# Patient Record
Sex: Male | Born: 2000 | Race: White | Hispanic: No | Marital: Single | Smoking: Never smoker
Health system: Southern US, Community
[De-identification: ages and names within clinical notes are randomized; demographics above are authoritative.]

## PROBLEM LIST (undated history)

## (undated) DIAGNOSIS — F840 Autistic disorder: Secondary | ICD-10-CM

## (undated) DIAGNOSIS — F419 Anxiety disorder, unspecified: Secondary | ICD-10-CM

## (undated) HISTORY — PX: EYE SURGERY: SHX253

---
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
## 2020-11-08 ENCOUNTER — Encounter: Payer: Self-pay | Admitting: Nurse Practitioner

## 2020-11-08 ENCOUNTER — Other Ambulatory Visit: Payer: Self-pay

## 2020-11-08 ENCOUNTER — Other Ambulatory Visit: Payer: Self-pay | Admitting: Nurse Practitioner

## 2020-11-08 ENCOUNTER — Ambulatory Visit (INDEPENDENT_AMBULATORY_CARE_PROVIDER_SITE_OTHER): Payer: 59 | Admitting: Nurse Practitioner

## 2020-11-08 VITALS — BP 112/64 | HR 88 | Temp 98.4°F | Ht 70.4 in | Wt 154.6 lb

## 2020-11-08 DIAGNOSIS — Z008 Encounter for other general examination: Secondary | ICD-10-CM | POA: Diagnosis not present

## 2020-11-08 DIAGNOSIS — Z0000 Encounter for general adult medical examination without abnormal findings: Secondary | ICD-10-CM

## 2020-11-08 DIAGNOSIS — Z7689 Persons encountering health services in other specified circumstances: Secondary | ICD-10-CM | POA: Diagnosis not present

## 2020-11-08 NOTE — Patient Instructions (Signed)

## 2020-11-08 NOTE — Progress Notes (Signed)
I,Tianna Badgett,acting as a Education administrator for [employer], NP.,have documented all relevant documentation on the behalf of [employer], NP,as directed by  Bary Castilla, NP while in the presence of Bary Castilla, NP.  This visit occurred during the SARS-CoV-2 public health emergency.  Safety protocols were in place, including screening questions prior to the visit, additional usage of staff PPE, and extensive cleaning of exam room while observing appropriate contact time as indicated for disinfecting solutions.  Subjective:     Patient ID: Erik Saunders , male    DOB: Jun 27, 2000 , 20 y.o.   MRN: [id number]   Chief Complaint  Patient presents with  . Establish Care    HPI  Patient is here to establish care. He is a Ship broker at [employer].  He would like like to see a counselor concerning his autism.  He likes to walk, watches TV, listen to music, he likes to go to concerts. He likes to war hammer.  He was taking benadryl. But doesn't take any other medications at this time. He has no other concenrs at this time.  Diet: he eats anything.  Exercise: he doesn't but he tries to walk.  He last time saw a doctor in 2016.     Past Medical History:  Diagnosis Date  . Anxiety   . Autism      Family History  Problem Relation Age of Onset  . Bipolar disorder Mother   . Cancer Maternal Grandmother     No current outpatient medications on file.   Allergies  Allergen Reactions  . Penicillins Hives     Men's preventive visit. Patient Health Questionnaire (PHQ-2) is  [place] Office Visit from 11/08/2020 in [facility]  PHQ-2 Total Score 0    . Patient is on any diet. Marital status: Single. Relevant history for alcohol use is:  Social History   Substance and Sexual Activity  Alcohol Use No  . Relevant history for tobacco use is: none  Social History   Tobacco Use  Smoking Status Never Smoker  Smokeless Tobacco  Never Used  .   Review of Systems  Constitutional: Negative.  Negative for chills, fatigue and fever.  HENT: Negative.  Negative for congestion, ear pain, sinus pressure and sneezing.   Eyes: Negative.   Respiratory: Negative.  Negative for cough, chest tightness, shortness of breath and wheezing.   Cardiovascular: Negative.  Negative for chest pain.  Gastrointestinal: Negative.   Endocrine: Negative.  Negative for polydipsia, polyphagia and polyuria.  Genitourinary: Negative.  Negative for flank pain and testicular pain.  Musculoskeletal: Negative.  Negative for arthralgias, myalgias and neck stiffness.  Skin: Negative.   Allergic/Immunologic: Negative.   Neurological: Negative.  Negative for dizziness, weakness and numbness.  Hematological: Negative.   Psychiatric/Behavioral: Negative.  Negative for agitation.     Today's Vitals   11/08/20 0947  BP: 112/64  Pulse: 88  Temp: 98.4 F (36.9 C)  TempSrc: Oral  Weight: 154 lb 9.6 oz (70.1 kg)  Height: 5' 10.4" (1.788 m)   Body mass index is 21.93 kg/m.   Objective:  Physical Exam Vitals and nursing note reviewed.  Constitutional:      Appearance: Normal appearance.  HENT:     Head: Normocephalic and atraumatic.     Right Ear: Tympanic membrane, ear canal and external ear normal.     Left Ear: Tympanic membrane, ear canal and external ear normal.     Nose: Nose normal.  Mouth/Throat:     Mouth: Mucous membranes are moist.     Pharynx: Oropharynx is clear.  Eyes:     Extraocular Movements: Extraocular movements intact.     Conjunctiva/sclera: Conjunctivae normal.     Pupils: Pupils are equal, round, and reactive to light.  Cardiovascular:     Rate and Rhythm: Normal rate and regular rhythm.     Pulses: Normal pulses.     Heart sounds: Normal heart sounds. No murmur heard.   Pulmonary:     Effort: Pulmonary effort is normal.     Breath sounds: Normal breath sounds. No wheezing.  Chest:  Breasts:     Right:  Normal. No swelling, bleeding, inverted nipple, mass or nipple discharge.     Left: Normal. No swelling, bleeding, inverted nipple, mass or nipple discharge.    Abdominal:     General: Abdomen is flat. Bowel sounds are normal.     Palpations: Abdomen is soft.     Hernia: There is no hernia in the left inguinal area or right inguinal area.  Genitourinary:    Penis: Normal.      Testes: Normal.        Right: Tenderness not present.        Left: Tenderness not present.     Rectum: Guaiac result negative.  Musculoskeletal:        General: Normal range of motion.     Cervical back: Normal range of motion and neck supple.  Skin:    General: Skin is warm.     Capillary Refill: Capillary refill takes less than 2 seconds.  Neurological:     General: No focal deficit present.     Mental Status: He is alert and oriented to person, place, and time.  Psychiatric:        Mood and Affect: Mood normal.        Behavior: Behavior normal.         Assessment And Plan:    1. Establishing care with new doctor, encounter for -Patient is here to establish care. Martin Majestic over patient medical, family, social and surgical history. -Reviewed with patient their medications and any allergies  -Reviewed with patient their sexual orientation, drug/tobacco and alcohol use -Dicussed any new concerns with patient  -recommended patient comes in for a physical exam and complete blood work.  -Educated patient about the importance of annual screenings and immunizations.  -Advised patient to eat a healthy diet along with exercise for atleast 30-45 min atleast 4-5 days of the week.   2. Encounter for annual physical exam --Patient is here for their annual physical exam and we discussed any changes to medication and medical history.  -Behavior modification was discussed as well as diet and exercise history  -Patient will continue to exercise regularly and modify their diet.  -Recommendation for yearly physical  annuals, immunization and screenings including mammogram and colonoscopy were discussed with the patient.  -Recommended intake of multivitamin, vitamin D and calcium.  -Individualized advise was given to the patient pertaining to their own health history in regards to diet, exercise, medical condition and referrals.  - CBC - Hemoglobin A1c - CMP14+EGFR - Lipid panel - Hepatitis C antibody  3. Evaluation by psychiatric service required - Ambulatory referral to Psychology -Patient wants to be evaluated by a counselor or therapist for a program he is trying to get in for school.   The patient was encouraged to call or send a message through [employer] for any questions or concerns.  Patient was given opportunity to ask questions. Patient verbalized understanding of the plan and was able to repeat key elements of the plan. All questions were answered to their satisfaction.  Erik Winona Sison, DNP   I, Erik Wylie Coon have reviewed all documentation for this visit. The documentation on 11/08/20 for the exam, diagnosis, procedures, and orders are all accurate and complete.    THE PATIENT IS ENCOURAGED TO PRACTICE SOCIAL DISTANCING DUE TO THE COVID-19 PANDEMIC.

## 2020-11-09 LAB — CMP14+EGFR
ALT: 10 IU/L (ref 0–44)
AST: 22 IU/L (ref 0–40)
Albumin/Globulin Ratio: 1.6 (ref 1.2–2.2)
Albumin: 4.6 g/dL (ref 4.1–5.2)
Alkaline Phosphatase: 81 IU/L (ref 44–121)
BUN/Creatinine Ratio: 13 (ref 9–20)
BUN: 13 mg/dL (ref 6–20)
Bilirubin Total: 0.4 mg/dL (ref 0.0–1.2)
CO2: 24 mmol/L (ref 20–29)
Calcium: 9.9 mg/dL (ref 8.7–10.2)
Chloride: 103 mmol/L (ref 96–106)
Creatinine, Ser: 1.02 mg/dL (ref 0.76–1.27)
Globulin, Total: 2.8 g/dL (ref 1.5–4.5)
Glucose: 102 mg/dL — ABNORMAL HIGH (ref 65–99)
Potassium: 4.3 mmol/L (ref 3.5–5.2)
Sodium: 141 mmol/L (ref 134–144)
Total Protein: 7.4 g/dL (ref 6.0–8.5)
eGFR: 107 mL/min/{1.73_m2} (ref >59)

## 2020-11-09 LAB — HEMOGLOBIN A1C
Est. average glucose Bld gHb Est-mCnc: 100 mg/dL
Hgb A1c MFr Bld: 5.1 % (ref 4.8–5.6)

## 2020-11-09 LAB — CBC
Hematocrit: 40.8 % (ref 37.5–51.0)
Hemoglobin: 13.6 g/dL (ref 13.0–17.7)
MCH: 28.8 pg (ref 26.6–33.0)
MCHC: 33.3 g/dL (ref 31.5–35.7)
MCV: 86 fL (ref 79–97)
Platelets: 325 10*3/uL (ref 150–450)
RBC: 4.73 x10E6/uL (ref 4.14–5.80)
RDW: 11.9 % (ref 11.6–15.4)
WBC: 4.1 10*3/uL (ref 3.4–10.8)

## 2020-11-09 LAB — LIPID PANEL
Chol/HDL Ratio: 3.6 ratio (ref 0.0–5.0)
Cholesterol, Total: 161 mg/dL (ref 100–199)
HDL: 45 mg/dL (ref >39)
LDL Chol Calc (NIH): 104 mg/dL — ABNORMAL HIGH (ref 0–99)
Triglycerides: 59 mg/dL (ref 0–149)
VLDL Cholesterol Cal: 12 mg/dL (ref 5–40)

## 2020-11-09 LAB — HEPATITIS C ANTIBODY: Hep C Virus Ab: <0.1 s/co ratio (ref 0.0–0.9)

## 2020-12-05 ENCOUNTER — Other Ambulatory Visit: Payer: Self-pay

## 2020-12-05 ENCOUNTER — Ambulatory Visit (INDEPENDENT_AMBULATORY_CARE_PROVIDER_SITE_OTHER): Payer: 59 | Admitting: Nurse Practitioner

## 2020-12-05 VITALS — BP 104/62 | HR 84 | Temp 97.9°F | Ht 70.4 in | Wt 152.8 lb

## 2020-12-05 DIAGNOSIS — F840 Autistic disorder: Secondary | ICD-10-CM | POA: Diagnosis not present

## 2020-12-05 NOTE — Patient Instructions (Signed)
Living With Autism Spectrum Disorder Autism spectrum disorder (ASD) is a group of developmental disorders that affect how someone communicates, interacts with others, and behaves. ASD affects each person differently, so it is challenging to find treatments that can be effective for all people who have it. If you have been diagnosed with ASD, you may be relieved to now know why you have felt different or behaved a certain way. Still, you may have questions about the treatment ahead, how to get the support you need, and how to deal with ASD from day to day. With treatment and support, you can live a full life with ASD and manage your symptoms. How to manage lifestyle changes Managing stress   Stress is your body's reaction to life changes and events, both good and bad. Stress can last just a few hours, or it can be ongoing. Talk with your health care provider or therapist if you would like to learn more about ways to reduce your stress. Choose a method of lowering stress (stress reduction technique) that fits your lifestyle and personality, such as: Positive thinking. The things you say to yourself (self-talk) can be positive or negative. Positive self-talk can help you feel better. Deep breathing. To do this: Slowly breathe in through your nose and expand your belly. Hold your breath for 3-5 seconds. Slowly breathe out, letting your belly muscles relax. Muscle relaxation. To do this, tense your muscles on purpose and then relax them. Keeping a stress diary. This can help you learn what causes your stress to start (figure out your triggers) and how to control your response to those triggers. Adding humor to your life by watching funny films or TV shows. Getting plenty of sleep. Doing things that help you relax, such as exercise, music, or art.  Medicines Along with therapy, your health care provider may prescribe medicine to treat other conditions you have. These may include: Anxiety or  depression. Aggression or feeling irritable. Being unable to pay attention (inattention). Being overly active (hyperactivity). Sleep problems. Repeating physical or mental acts that you feel you have to do (compulsive behavior). Seizures. Talk with your pharmacist or health care provider about: All medicines that you take. The side effects they may cause. Which medicines are safe to take together. Avoid using alcohol and other substances that may prevent your medicines from working properly. Make it your goal to take part in all treatment decisions (shared decision-making). Ask about possible side effects of medicines that your health care provider recommends, and tell him or her how you feel about having those side effects. It is best if shared decision-making with your health care provider is part of your total treatment plan. Relationships Your health care provider may suggest social skills therapy along with individual therapy or medicine. With social skills training, you can: Learn about social cues and how to watch for them. Better understand nonverbal communication, such as facial expressions and body language. Learn appropriate responses in social situations. Improve your friendships. How to recognize changes in your condition Living with ASD is different for everyone. You and your health care provider will keep working together to decide next steps in your treatment plan. Still, it is important to watch for any symptoms of ASD that are getting worse. Talk with your health care provider if you feel that your symptoms are getting worse. Follow these instructions at home: Learn as much as you can about ASD. Make sure you understand it. Follow your treatment plan as told. Work closely   care providers and family to get educational, behavioral, and social therapies. Take over-the-counter and prescription medicines only as told by your health care provider. Check with your  health care provider before taking any new medicines. Keep all follow-up visits as told by your health care providers or therapists. This is important. Where to find support Talking to others Talking to friends and family about your ASD can give you support and guidance. Reach out to trusted family members or friends to explain your condition and how you are feeling. Also tell them that you are working with your health care provider. Start by telling them about any of your behaviors that are symptoms of ASD. Your diagnosis could help them understand why you sometimes have a hardtime connecting with friends or family. It is important for your family and close friends to learn as much as they can about your ASD. Doing this will help them understand your behavior and help youas needed. Finances When dealing with the costs of living with ASD, you can find financial help through not-for-profit organizations or with local government resources. You should also check with your insurance carrier to learn what ASD treatment iscovered by your plan. If you are taking medicines, you may be able to get the generic forms. These forms may cost less than brand-name medicines. Some makers of prescription medicines also offer help to people who cannot afford the medicines that theyneed. Therapy and support groups Your health care provider may recommend behavioral, educational, or social skills therapies. These involve working with a Warden/ranger, Child psychotherapist, Chiropractor, or other mental health provider. Therapy may help you reduce how severe your ASD symptoms are. It may also help you manage symptomsof other emotional or behavioral problems that you may have. Where to find more information Autism Speaks: AutismSpeaks.org Centers for Disease Control and Prevention Insurance claims handler): TelevisionDisplays.tn [employer] of Mental Health: WirelessNovelties.no World Health Organization:  [address].com.ee Chiropractor (ASHA): http://murphy-jones.com/ Autism Society: Editor, commissioning.org Asperger/Autism Network (AANE): aane.org Contact a health care provider if: You develop new symptoms. Your symptoms get worse or they do not get better with treatment. Get help right away if: You are acting in ways that harm yourself or others, or you have thoughts of hurting yourself or others. If you ever feel like you may hurt yourself or others, or have thoughts about taking your own life, get help right away. Go to your nearest emergency department or: Call your local emergency services (911 in the [place] Call a suicide crisis helpline, such as the National Suicide Prevention Lifeline at 226-681-4323. This is open 24 hours a day in the [place] Text the Crisis Text Line at 320-429-5674 (in the [place] Summary You can live a full life with ASD and manage your symptoms. A treatment plan that includes behavior therapy, social skills training, and medicines may help you improve your symptoms and manage stress. Your health care provider, friends, and family can give you support, and there are many not-for-profit and government resources to help people with ASD. This information is not intended to replace advice given to you by your health care provider. Make sure you discuss any questions you have with your healthcare provider. Document Revised: 07/13/2019 Document Reviewed: 07/13/2019 Elsevier Patient Education  2022 [employer].

## 2020-12-05 NOTE — Progress Notes (Signed)
I,Tianna Badgett,acting as a Neurosurgeon for [employer], NP.,have documented all relevant documentation on the behalf of [employer], NP,as directed by  Charlesetta Ivory, NP while in the presence of Charlesetta Ivory, NP.  This visit occurred during the SARS-CoV-2 public health emergency.  Safety protocols were in place, including screening questions prior to the visit, additional usage of staff PPE, and extensive cleaning of exam room while observing appropriate contact time as indicated for disinfecting solutions.  Subjective:     Patient ID: Erik Saunders , male    DOB: 2000/11/18 , 20 y.o.   MRN: [id number]   Chief Complaint  Patient presents with   Mental Health Problem    HPI  The patient is here because he was suppose to get a referral in for mental health evaluation for a program he is suppose to enroll in. The referral was already sent in on 11/08/20.   Mental Health Problem    Past Medical History:  Diagnosis Date   Anxiety    Autism      Family History  Problem Relation Age of Onset   Bipolar disorder Mother    Cancer Maternal Grandmother     No current outpatient medications on file.   Allergies  Allergen Reactions   Penicillins Hives     Review of Systems  Constitutional: Negative.   Respiratory: Negative.    Cardiovascular: Negative.   Gastrointestinal: Negative.   Neurological: Negative.     Today's Vitals   12/05/20 1533  BP: 104/62  Pulse: 84  Temp: 97.9 F (36.6 C)  TempSrc: Oral  Weight: 152 lb 12.8 oz (69.3 kg)  Height: 5' 10.4" (1.788 m)   Body mass index is 21.68 kg/m.   Objective:  Physical Exam      Assessment And Plan:     1. Autism  The patient is here to get a referral to be evaluated for mental health for a program he is trying to enroll in. The referral was placed on 11/08/20. The patient was given a call for [facility] and [facility]. They have both contacted the patient in  regards to scheduling appt for the patient. The patient and his care taker are here today to get clarification in regards to the referrals that are already in place. I have explained to the patient and his care taker that the referrals are taking a long time for patients to come in for an appointment. The patient and the care taker verbalized understanding and will call [facility] and [facility] to see who can take the patient quicker.   The patient was encouraged to call or send a message through MyChart for any questions or concerns.   Follow up: if symptoms persist or do not get better.   Patient was given opportunity to ask questions. Patient verbalized understanding of the plan and was able to repeat key elements of the plan. All questions were answered to their satisfaction.  [name], DNP   I, [name] have reviewed all documentation for this visit. The documentation on 12/05/20 for the exam, diagnosis, procedures, and orders are all accurate and complete.      IF YOU HAVE BEEN REFERRED TO A SPECIALIST, IT MAY TAKE 1-2 WEEKS TO SCHEDULE/PROCESS THE REFERRAL. IF YOU HAVE NOT HEARD FROM US/SPECIALIST IN TWO WEEKS, PLEASE GIVE [place] A CALL AT [phone].   THE PATIENT IS ENCOURAGED TO PRACTICE SOCIAL DISTANCING DUE TO THE COVID-19 PANDEMIC.

## 2020-12-24 ENCOUNTER — Ambulatory Visit: Payer: Self-pay | Admitting: Emergency Medicine

## 2021-02-01 ENCOUNTER — Other Ambulatory Visit: Payer: Self-pay

## 2021-02-01 ENCOUNTER — Ambulatory Visit (INDEPENDENT_AMBULATORY_CARE_PROVIDER_SITE_OTHER): Payer: 59

## 2021-02-01 ENCOUNTER — Ambulatory Visit (HOSPITAL_COMMUNITY)
Admission: EM | Admit: 2021-02-01 | Discharge: 2021-02-01 | Disposition: A | Discharge status: Home / Self Care | Payer: 59 | Attending: Internal Medicine | Admitting: Internal Medicine

## 2021-02-01 ENCOUNTER — Encounter (HOSPITAL_COMMUNITY): Payer: Self-pay | Admitting: *Deleted

## 2021-02-01 DIAGNOSIS — S93491A Sprain of other ligament of right ankle, initial encounter: Secondary | ICD-10-CM

## 2021-02-01 DIAGNOSIS — W19XXXA Unspecified fall, initial encounter: Secondary | ICD-10-CM

## 2021-02-01 DIAGNOSIS — M25571 Pain in right ankle and joints of right foot: Secondary | ICD-10-CM

## 2021-02-01 NOTE — Discharge Instructions (Addendum)
-  Use your boot while standing and walking while your pain persists -Tylenol/ibuprofen, rest, ice, elevation -If symptoms persist in 5-7 days, follow-up with an orthopedist. I recommend [facility] at [address], [place], [place] [address]. You can schedule an appointment by calling [phone]) or online ([email or web address]), but they also have a walk-in clinic M-F 8a-8p and Sat 10a-3p.

## 2021-02-01 NOTE — ED Provider Notes (Signed)
[facility]    CSN: [id number] Arrival date & time: 02/01/21  1221      History   Chief Complaint No chief complaint on file.   HPI Erik Saunders is a 20 y.o. male presenting with ankle issue. Medical history noncontributory.  States that he jumped a few days ago, landed and inverted the right ankle. Swelling to the outer aspect of the ankle. Purchased crutches which he has been using. Denies sensation changes. Denies falls.  HPI  Past Medical History:  Diagnosis Date   Anxiety    Autism     There are no problems to display for this patient.   Past Surgical History:  Procedure Laterality Date   EYE SURGERY         Home Medications    Prior to Admission medications   Not on File    Family History Family History  Problem Relation Age of Onset   Bipolar disorder Mother    Cancer Maternal Grandmother     Social History Social History   Tobacco Use   Smoking status: Never   Smokeless tobacco: Never  Substance Use Topics   Alcohol use: No   Drug use: No     Allergies   Penicillins   Review of Systems Review of Systems  Musculoskeletal:        R ankle pain    Physical Exam Triage Vital Signs ED Triage Vitals  Enc Vitals Group     BP      Pulse      Resp      Temp      Temp src      SpO2      Weight      Height      Head Circumference      Peak Flow      Pain Score      Pain Loc      Pain Edu?      Excl. in GC?    No data found.  Updated Vital Signs BP 121/70   Pulse 100   Temp 98.3 F (36.8 C)   Resp 20   SpO2 100%   Visual Acuity Right Eye Distance:   Left Eye Distance:   Bilateral Distance:    Right Eye Near:   Left Eye Near:    Bilateral Near:     Physical Exam Vitals reviewed.  Constitutional:      General: He is not in acute distress.    Appearance: Normal appearance. He is not ill-appearing or diaphoretic.  HENT:     Head: Normocephalic and atraumatic.  Cardiovascular:     Rate and Rhythm:  Normal rate and regular rhythm.     Heart sounds: Normal heart sounds.  Pulmonary:     Effort: Pulmonary effort is normal.     Breath sounds: Normal breath sounds.  Musculoskeletal:     Comments: R lateral ankle- effusion and tenderness lateral malleolus. TTP ATF ligament. ROM plantarflexion and dorsiflexion intact and without pain. Some pain with inversion. DP 2+, cap refill <2 seconds, sensation intact. No midfoot tenderness. No proximal tibial or fibular tenderness.   Skin:    General: Skin is warm.  Neurological:     General: No focal deficit present.     Mental Status: He is alert and oriented to person, place, and time.  Psychiatric:        Mood and Affect: Mood normal.        Behavior: Behavior normal.  Thought Content: Thought content normal.        Judgment: Judgment normal.     UC Treatments / Results  Labs (all labs ordered are listed, but only abnormal results are displayed) Labs Reviewed - No data to display  EKG   Radiology DG Ankle Complete Right  Result Date: 02/01/2021 CLINICAL DATA:  fall with pain and swelling to RT ankle EXAM: RIGHT ANKLE - COMPLETE 3+ VIEW COMPARISON:  None. FINDINGS: There is ankle soft tissue swelling, most prominent along the lateral ankle. There is no visible acute fracture. There is a tibiotalar joint effusion. Os trigonum. IMPRESSION: Ankle soft tissue swelling, most prominent along the lateral ankle. Tibiotalar joint effusion. No visible fracture. Electronically Signed   By: Caprice Renshaw M.D.   On: 02/01/2021 14:48    Procedures Procedures (including critical care time)  Medications Ordered in UC Medications - No data to display  Initial Impression / Assessment and Plan / UC Course  I have reviewed the triage vital signs and the nursing notes.  Pertinent labs & imaging results that were available during my care of the patient were reviewed by me and considered in my medical decision making (see chart for details).      This patient is a very pleasant 20 y.o. year old male presenting with R ankle sprain. Neurovascularly intact.   Xray R ankle- Ankle soft tissue swelling, most prominent along the lateral ankle. Tibiotalar joint effusion. No visible fracture.  CAM boot, crutches, f/u with ortho.   ED return precautions discussed. Patient verbalizes understanding and agreement.   Final Clinical Impressions(s) / UC Diagnoses   Final diagnoses:  Sprain of anterior talofibular ligament of right ankle, initial encounter     Discharge Instructions      -Use your boot while standing and walking while your pain persists -Tylenol/ibuprofen, rest, ice, elevation -If symptoms persist in 5-7 days, follow-up with an orthopedist. I recommend [facility] at [address], [place], [place] [address]. You can schedule an appointment by calling [phone]) or online ([email or web address]), but they also have a walk-in clinic M-F 8a-8p and Sat 10a-3p.      ED Prescriptions   None    PDMP not reviewed this encounter.   Erik Martini, PA-C 02/01/21 [phone]

## 2021-02-01 NOTE — ED Triage Notes (Signed)
Pt presented with injury to Rt ankle with swelling and Pain. Pt had his own crutches on arrival to UC.

## 2021-05-21 ENCOUNTER — Ambulatory Visit: Payer: 59 | Admitting: Nurse Practitioner

## 2021-05-21 ENCOUNTER — Other Ambulatory Visit: Payer: Self-pay

## 2021-05-21 ENCOUNTER — Encounter: Payer: Self-pay | Admitting: Internal Medicine

## 2021-05-21 ENCOUNTER — Ambulatory Visit (INDEPENDENT_AMBULATORY_CARE_PROVIDER_SITE_OTHER): Payer: 59 | Admitting: Internal Medicine

## 2021-05-21 VITALS — BP 110/62 | HR 95 | Temp 98.6°F | Ht 70.4 in | Wt 150.8 lb

## 2021-05-21 DIAGNOSIS — Z23 Encounter for immunization: Secondary | ICD-10-CM | POA: Insufficient documentation

## 2021-05-21 DIAGNOSIS — R3121 Asymptomatic microscopic hematuria: Secondary | ICD-10-CM | POA: Diagnosis not present

## 2021-05-21 DIAGNOSIS — R339 Retention of urine, unspecified: Secondary | ICD-10-CM | POA: Diagnosis not present

## 2021-05-21 LAB — POCT URINALYSIS DIPSTICK
Bilirubin, UA: NEGATIVE
Glucose, UA: NEGATIVE
Ketones, UA: POSITIVE
Leukocytes, UA: NEGATIVE
Nitrite, UA: NEGATIVE
Protein, UA: NEGATIVE
Spec Grav, UA: 1.02 (ref 1.010–1.025)
Urobilinogen, UA: 1 E.U./dL
pH, UA: 7.5 (ref 5.0–8.0)

## 2021-05-21 NOTE — Progress Notes (Signed)
Jeri Cos [place],acting as a Neurosurgeon for Gwynneth Aliment, MD.,have documented all relevant documentation on the behalf of Gwynneth Aliment, MD,as directed by  Gwynneth Aliment, MD while in the presence of Gwynneth Aliment, MD.  This visit occurred during the SARS-CoV-2 public health emergency.  Safety protocols were in place, including screening questions prior to the visit, additional usage of staff PPE, and extensive cleaning of exam room while observing appropriate contact time as indicated for disinfecting solutions.  Subjective:     Patient ID: Erik Saunders , male    DOB: [birth year] , 20 y.o.   MRN: [id number]   Chief Complaint  Patient presents with   Urinary Retention     HPI  Patient presents today for urinary concerns. He stated "he has been having issues with this for several years he just thought it was normal but recently found out its not normal."  He states that he has sensation that he cannot void all of his urine. He denies dysuria, urinary frequency; however, admits to dribbling. He denies having any sexual intercourse. He denies having any abdominal pain. He is not having any sx today. He requests referral to Urology for further evaluation of his sx. He is concerned about contractures and having a prostate issue.    Past Medical History:  Diagnosis Date   Anxiety    Autism      Family History  Problem Relation Age of Onset   Bipolar disorder Mother    Cancer Maternal Grandmother     No current outpatient medications on file.   Allergies  Allergen Reactions   Penicillins Hives     Review of Systems  Constitutional: Negative.   Respiratory: Negative.    Cardiovascular: Negative.   Gastrointestinal: Negative.   Neurological: Negative.   Psychiatric/Behavioral: Negative.      Today's Vitals   05/21/21 1458  BP: 110/62  Pulse: 95  Temp: 98.6 F (37 C)  Weight: 150 lb 12.8 oz (68.4 kg)  Height: 5' 10.4" (1.788 m)  PainSc: 0-No pain   Body mass  index is 21.39 kg/m.  Wt Readings from Last 3 Encounters:  05/21/21 150 lb 12.8 oz (68.4 kg)  12/05/20 152 lb 12.8 oz (69.3 kg)  11/08/20 154 lb 9.6 oz (70.1 kg)     Objective:  Physical Exam Vitals and nursing note reviewed.  Constitutional:      Appearance: Normal appearance.  HENT:     Head: Normocephalic and atraumatic.     Nose:     Comments: Masked     Mouth/Throat:     Comments: Masked  Eyes:     Extraocular Movements: Extraocular movements intact.  Cardiovascular:     Rate and Rhythm: Normal rate and regular rhythm.     Heart sounds: Normal heart sounds.  Pulmonary:     Effort: Pulmonary effort is normal.     Breath sounds: Normal breath sounds.  Abdominal:     General: There is no distension.     Tenderness: There is no abdominal tenderness. There is no right CVA tenderness or left CVA tenderness.  Musculoskeletal:     Cervical back: Normal range of motion.  Skin:    General: Skin is warm.  Neurological:     General: No focal deficit present.     Mental Status: He is alert.  Psychiatric:        Mood and Affect: Mood normal.        Assessment And Plan:  1. Urinary retention Comments: No leuks or nitrites in u/a, will treat empirically for UTI. I will also refer him to Urology as requested.  - POCT Urinalysis Dipstick (81002)  2. Asymptomatic microscopic hematuria Comments: Pt advised this will be repeated at his next visit.   3. Immunization due Comments: He was given flu vaccine.  - Flu Vaccine QUAD 6+ mos PF IM (Fluarix Quad PF)    Patient was given opportunity to ask questions. Patient verbalized understanding of the plan and was able to repeat key elements of the plan. All questions were answered to their satisfaction.   I, Maximino Greenland, MD, have reviewed all documentation for this visit. The documentation on 05/21/21 for the exam, diagnosis, procedures, and orders are all accurate and complete.   IF YOU HAVE BEEN REFERRED TO A SPECIALIST,  IT MAY TAKE 1-2 WEEKS TO SCHEDULE/PROCESS THE REFERRAL. IF YOU HAVE NOT HEARD FROM US/SPECIALIST IN TWO WEEKS, PLEASE GIVE [place] A CALL AT [phone].   THE PATIENT IS ENCOURAGED TO PRACTICE SOCIAL DISTANCING DUE TO THE COVID-19 PANDEMIC.

## 2021-05-21 NOTE — Patient Instructions (Signed)
Acute Urinary Retention, Male ?Acute urinary retention is a condition in which a person is unable to pass urine or can only pass a little urine. This condition can happen suddenly and last for a short time. If left untreated, it can become long-term (chronic) and result in kidney damage or other serious complications. ?What are the causes? ?This condition may be caused by: ?Obstruction or narrowing of the tube that drains the bladder (urethra). This may be caused by surgery, problems with nearby organs, or injury to the bladder or urethra. ?Problems with the nerves in the bladder. ?Tumors in the area of the pelvis, bladder, or urethra. ?Certain medicines. ?Bladder or urinary tract infection. ?Constipation. ?What increases the risk? ?This condition is more likely to develop in older men. As men age, their prostate may become larger and may start to press or squeeze on the bladder or the urethra. Other chronic health conditions can increase the risk of acute urinary retention. These include: ?Diseases such as multiple sclerosis. ?Spinal cord injuries. ?Diabetes. ?Degenerative cognitive conditions, such as delirium or dementia. ?Psychological conditions. A man may hold his urine due to trauma or because he does not want to use the bathroom. ?What are the signs or symptoms? ?Symptoms of this condition include: ?Trouble urinating. ?Pain in the lower abdomen. ?How is this diagnosed? ?This condition is diagnosed based on a physical exam and your medical history. You may also have other tests, including: ?An ultrasound of the bladder or kidneys or both. ?Blood tests. ?A urine analysis. ?Additional tests may be needed, such as a CT scan, MRI, and kidney or bladder function tests. ?How is this treated? ?Treatment for this condition may include: ?Medicines. ?Placing a thin, sterile tube (catheter) into the bladder to drain urine out of the body. This is called an indwelling urinary catheter. After it is inserted, the catheter  is held in place with a small balloon that is filled with sterile water. Urine drains from the catheter into a collection bag outside of the body. ?Behavioral therapy. ?Treatment for other conditions. ?If needed, you may be treated in the hospital for kidney function problems or to manage other complications. ?Follow these instructions at home: ?Medicines ?Take over-the-counter and prescription medicines only as told by your health care provider. Avoid certain medicines, such as decongestants, antihistamines, and some prescription medicines. Do not take any medicine unless your health care provider approves. ?If you were prescribed an antibiotic medicine, take it as told by your health care provider. Do not stop using the antibiotic even if you start to feel better. ?General instructions ?Do not use any products that contain nicotine or tobacco. These products include cigarettes, chewing tobacco, and vaping devices, such as e-cigarettes. If you need help quitting, ask your health care provider. ?Drink enough fluid to keep your urine pale yellow. ?If you have an indwelling urinary catheter, follow the instructions from your health care provider. ?Monitor any changes in your symptoms. Tell your health care provider about any changes. ?If instructed, monitor your blood pressure at home. Report changes as told by your health care provider. ?Keep all follow-up visits. This is important. ?Contact a health care provider if: ?You have uncomfortable bladder contractions that you cannot control (spasms). ?You leak urine with the spasms. ?Get help right away if: ?You have chills or a fever. ?You have blood in your urine. ?You have a catheter and the following happens: ?Your catheter stops draining urine. ?Your catheter falls out. ?Summary ?Acute urinary retention is a condition in which   a person is unable to pass urine or can only pass a little urine. If left untreated, this condition can result in kidney damage or other  serious complications. ?An enlarged prostate may cause this condition. As men age, their prostate gland may become larger and may press or squeeze on the bladder or the urethra. ?Treatment for this condition may include medicines and placement of an indwelling urinary catheter. ?Monitor any changes in your symptoms. Tell your health care provider about any changes. ?This information is not intended to replace advice given to you by your health care provider. Make sure you discuss any questions you have with your health care provider. ?Document Revised: 02/15/2020 Document Reviewed: 02/15/2020 ?Elsevier Patient Education ? 2022 Elsevier Inc. ? ?

## 2021-05-27 ENCOUNTER — Other Ambulatory Visit: Payer: Self-pay

## 2021-05-27 MED ORDER — DOXYCYCLINE HYCLATE 100 MG PO TABS: 100.0000 mg | ORAL_TABLET | Freq: Two times a day (BID) | ORAL | 0 refills | Status: AC

## 2021-11-14 ENCOUNTER — Encounter: Payer: 59 | Admitting: Nurse Practitioner

## 2021-11-20 ENCOUNTER — Encounter: Payer: 59 | Admitting: Nurse Practitioner

## 2022-07-24 IMAGING — DX DG ANKLE COMPLETE 3+V*R*
3 series · 3 of 3 positions shown · non-contrast
Comparison: None.

CLINICAL DATA: fall with pain and swelling to RT ankle

EXAM:
RIGHT ANKLE - COMPLETE 3+ VIEW

[ankle ap]
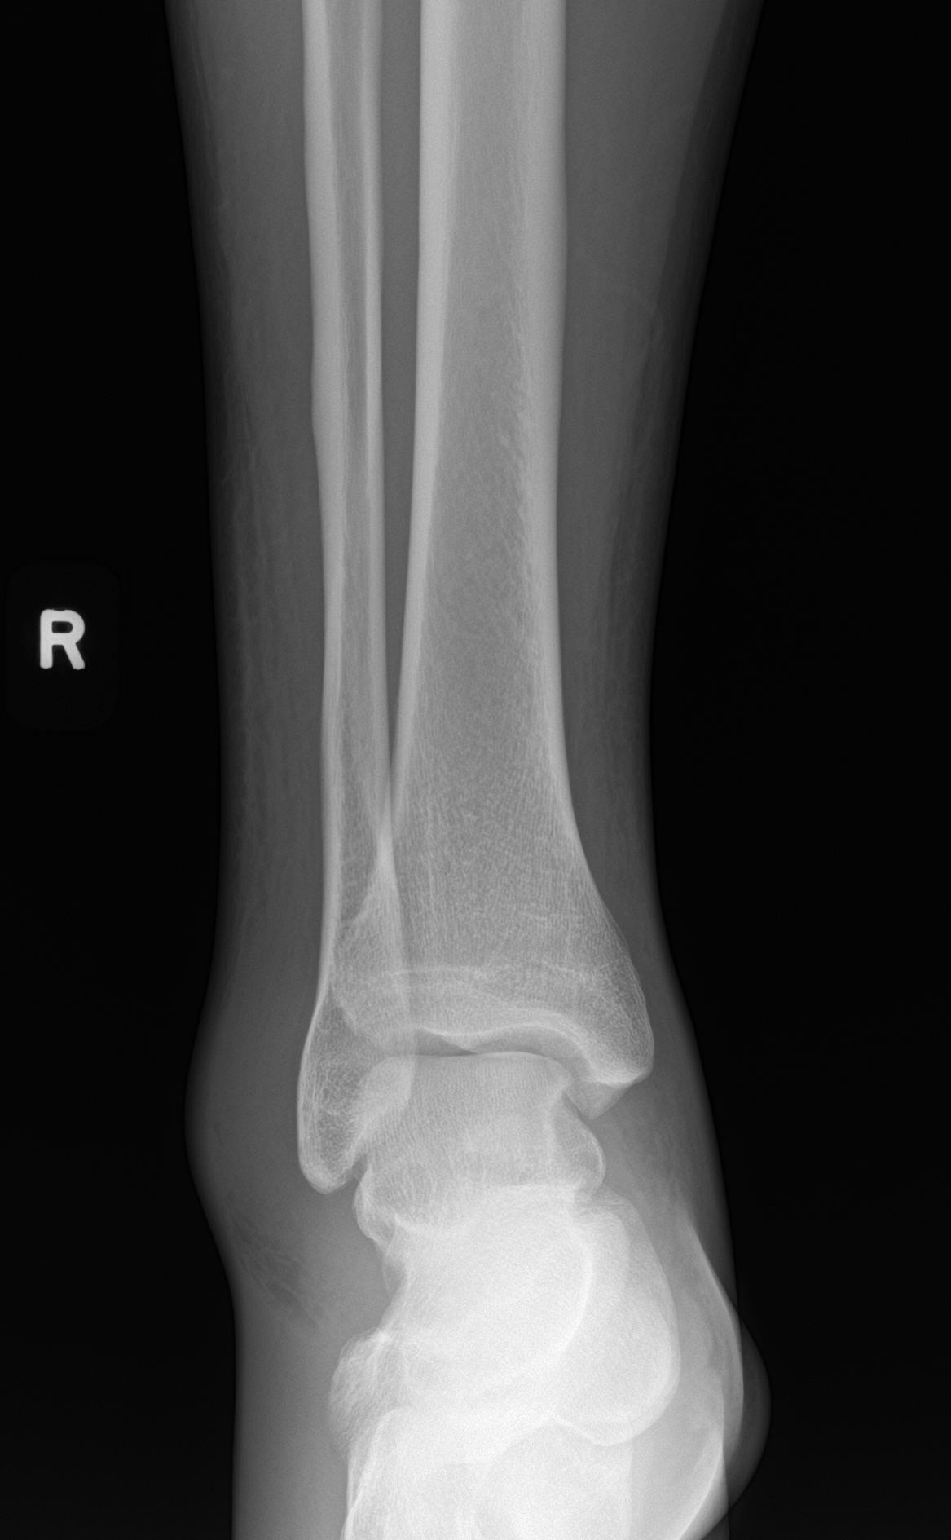

[ankle obl]
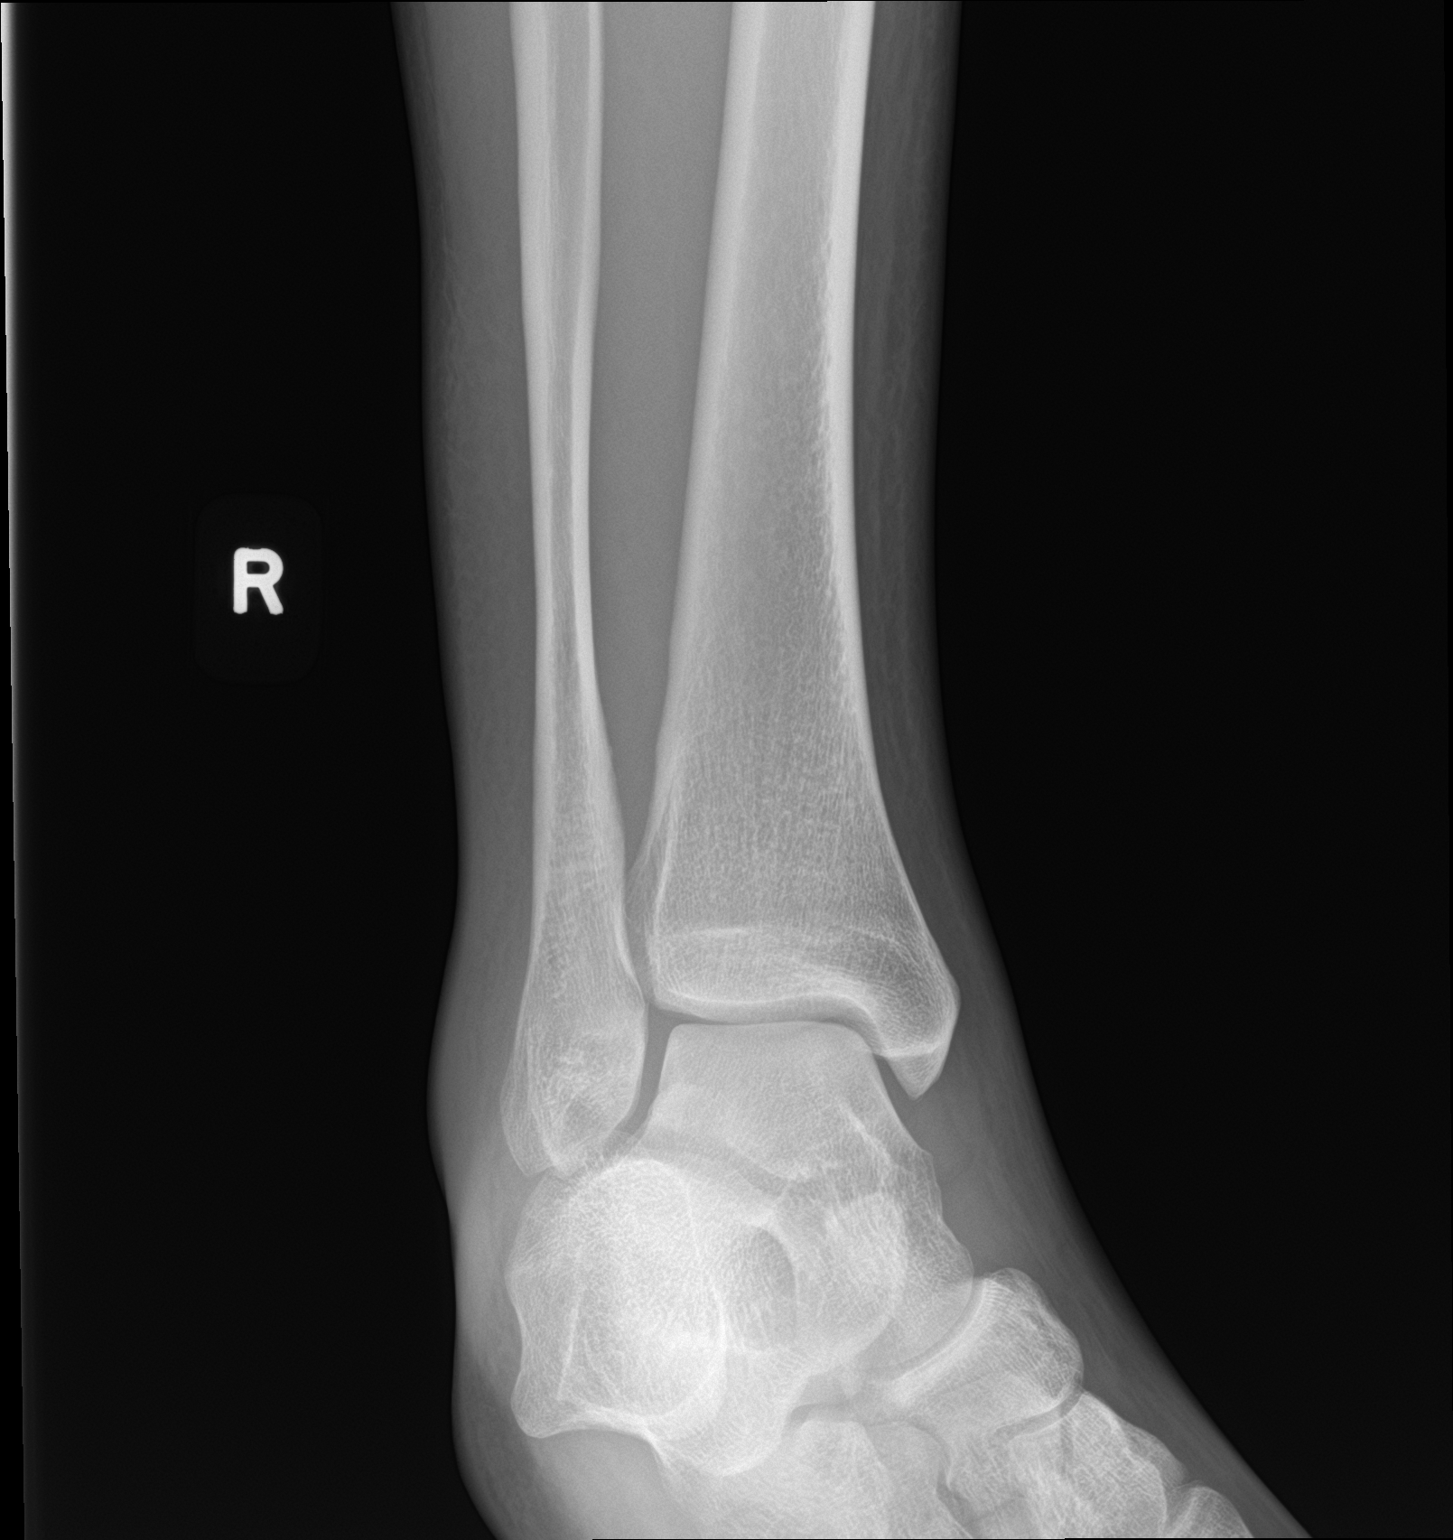

[ankle lat]
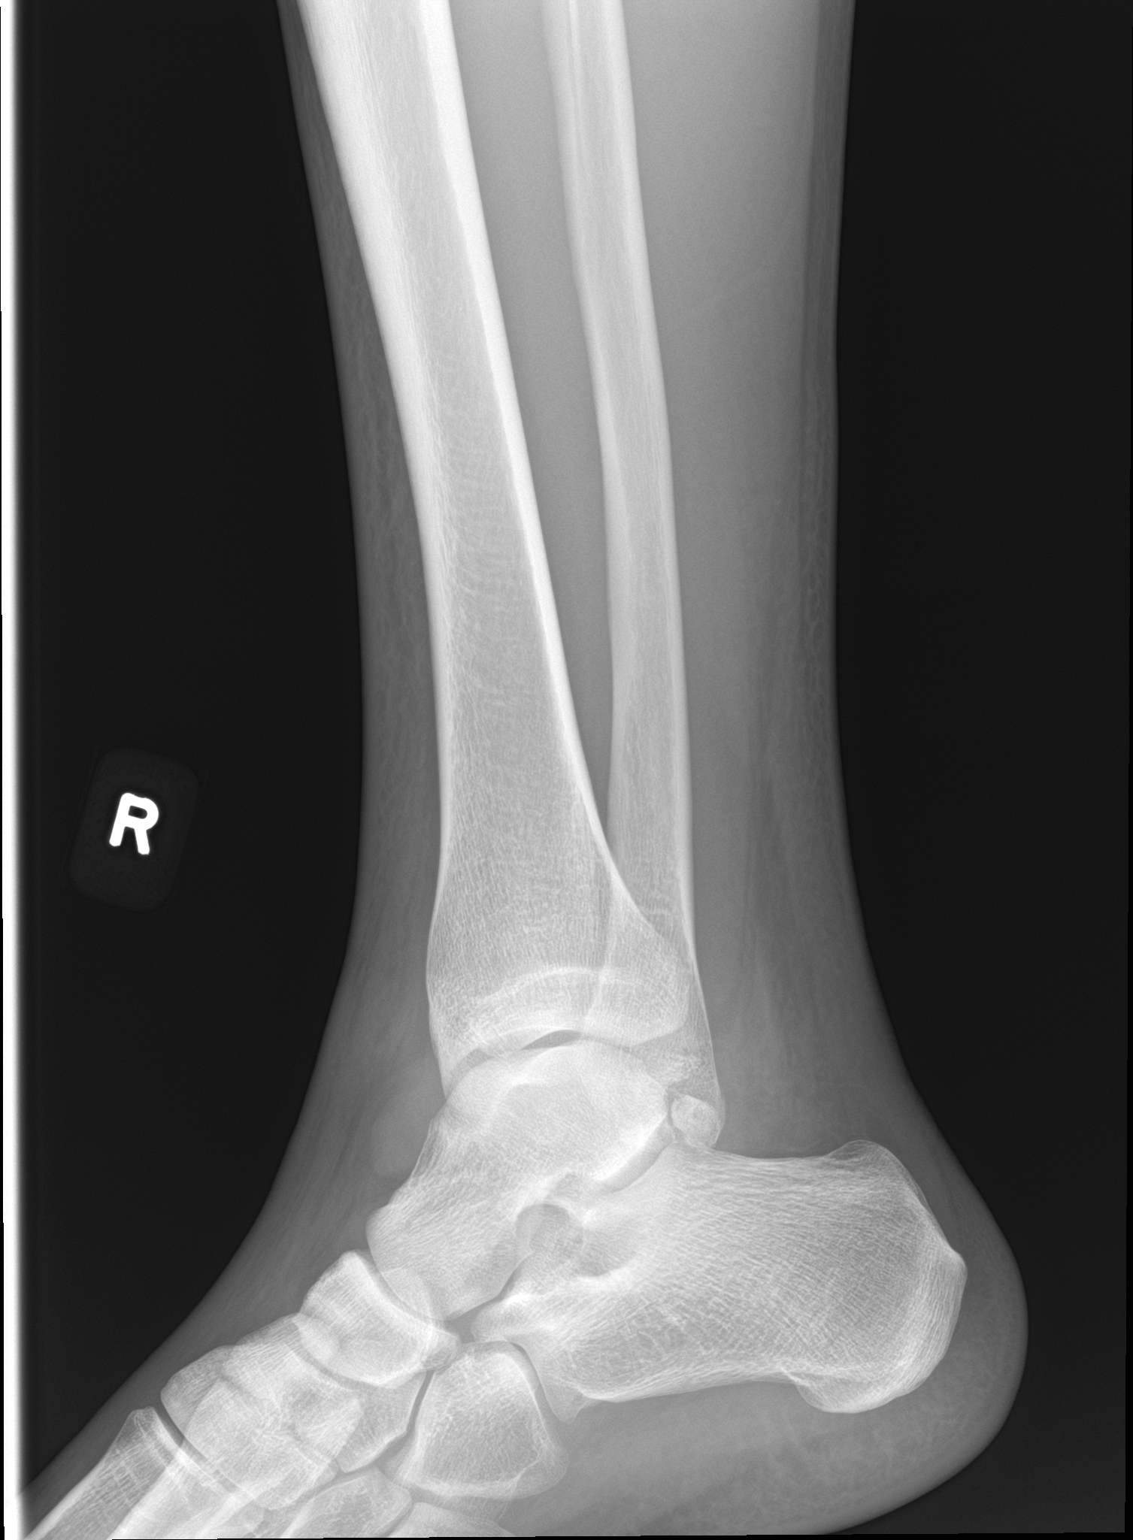

[3 of 3 positions shown; findings below may reference images not displayed]

FINDINGS: There is ankle soft tissue swelling, most prominent along the
lateral ankle. There is no visible acute fracture. There is a
tibiotalar joint effusion. Os trigonum.
IMPRESSION: Ankle soft tissue swelling, most prominent along the lateral ankle.
Tibiotalar joint effusion. No visible fracture.

## 2022-09-18 ENCOUNTER — Ambulatory Visit (HOSPITAL_COMMUNITY): Payer: Medicaid Other | Admitting: Mental Health

## 2022-09-18 DIAGNOSIS — F840 Autistic disorder: Secondary | ICD-10-CM

## 2022-09-18 DIAGNOSIS — F411 Generalized anxiety disorder: Secondary | ICD-10-CM | POA: Insufficient documentation

## 2022-09-19 NOTE — Progress Notes (Signed)
Comprehensive Clinical Assessment (CCA) Note  09/19/2022 Erik Saunders [id number]  Chief Complaint:  Chief Complaint  Patient presents with   Autism   Anxiety   Establish Care   Visit Diagnosis: GAD, Autism spectrum disorder     CCA Screening, Triage and Referral (STR)  Patient Reported Information How did you hear about [place]? Self  Whom do you see for routine medical problems? Primary Care  Practice/Facility Name: Dr. Allyne Gee - [facility]  What Is the Reason for Your Visit/Call Today? "I feel like whats been going on overall. I feel like I have a little bit of a temper issues sometimes."  How Long Has This Been Causing You Problems? > than 6 months  What Do You Feel Would Help You the Most Today? Treatment for Depression or other mood problem   Have You Recently Been in Any Inpatient Treatment (Hospital/Detox/Crisis Center/28-Day Program)? No  Have You Ever Received Services From [employer] Before? No  Have You Recently Had Any Thoughts About Hurting Yourself? No  Are You Planning to Commit Suicide/Harm Yourself At This time? No   Have you Recently Had Thoughts About Hurting Someone Karolee Ohs? No   Have You Used Any Alcohol or Drugs in the Past 24 Hours? No  Do You Currently Have a Therapist/Psychiatrist? No  Have You Been Recently Discharged From Any Office Practice or Programs? No     CCA Screening Triage Referral Assessment Type of Contact: Face-to-Face  Does Patient Have a Court Appointed Legal Guardian? No  Is CPS involved or ever been involved? Never  Is APS involved or ever been involved? Never   Patient Determined To Be At Risk for Harm To Self or Others Based on Review of Patient Reported Information or Presenting Complaint? No  Method: No Plan  Availability of Means: No access or NA  Intent: Vague intent or NA  Notification Required: No need or identified person  Are There Guns or Other Weapons in Your Home?  Yes  Types of Guns/Weapons: " I rather not say" but shares it would be locked up  Are These Weapons Safely Secured?                            Yes  Do You Have any Outstanding Charges, Pending Court Dates, Parole/Probation?  Denies  Location of Assessment: [facility]   Does Patient Present under Involuntary Commitment? No  [place] of Residence: [place]   Patient Currently Receiving the Following Services: Not Receiving Services   Determination of Need: Routine (7 days)   Options For Referral: Medication Management; Outpatient Therapy     CCA Biopsychosocial Intake/Chief Complaint:  "I feel like whats been going on overall. I feel like I have a little bit of a temper issues sometimes. I generally manage it pretty well but right when the incident when we had to move out. Our landlord wanted [place] out in 30 days. So I had to stress pack, at that point I started to have anger issues. My whole family felt like we were wronged as a result. Every since we have had to pay more for stufff with rent and other utilities. We have had also had a few issues with the condition. " Erik Saunders 22 year old Caucasian single male who preents for routine assessment at [facility], self referred. Erik Saunders reports history of being diagnosed with generalized anxiety x 10 years ago; notes to be based on things in  which he would rather not disclose at this time. Autism dx. Reports current stressor to include moving and dealing with current housing concerns; notes to also be looking for a better job.  Current Symptoms/Problems: difficulty with irritabilty and anger at times.   Patient Reported Schizophrenia/Schizoaffective Diagnosis in Past: No data recorded  Strengths: hard worker  Preferences: afternoon appointments  Abilities: persevering   Type of Services Patient Feels are Needed: OPT   Initial Clinical Notes/Concerns: No data recorded  Mental Health Symptoms Depression:   Irritability    Duration of Depressive symptoms:  N/A   Mania:   None   Anxiety:    Worrying; Tension; Sleep; Restlessness; Irritability (hx of anxiety attacks)   Psychosis:   None   Duration of Psychotic symptoms: No data recorded  Trauma:   None   Obsessions:   None   Compulsions:   None   Inattention:   None   Hyperactivity/Impulsivity:   None   Oppositional/Defiant Behaviors:   Temper   Emotional Irregularity:  No data recorded  Other Mood/Personality Symptoms:   Shares at times anger can be explosive    Mental Status Exam Appearance and self-care  Stature:   Average   Weight:   Average weight   Clothing:   Casual   Grooming:   Normal   Cosmetic use:   None   Posture/gait:   Normal   Motor activity:   Not Remarkable   Sensorium  Attention:   Normal   Concentration:   Normal   Orientation:   X5   Recall/memory:   Normal   Affect and Mood  Affect:   Appropriate   Mood:   Anxious   Relating  Eye contact:   Normal   Facial expression:   Responsive   Attitude toward examiner:   Cooperative   Thought and Language  Speech flow:  Clear and Coherent   Thought content:   Appropriate to Mood and Circumstances   Preoccupation:   None   Hallucinations:   None   Organization:  No data recorded  [employer] of Knowledge:   Fair   Intelligence:   Needs investigation   Abstraction:   Functional   Judgement:   Fair   Reality Testing:   Adequate   Insight:   Fair   Decision Making:   Normal   Social Functioning  Social Maturity:   Responsible   Social Judgement:   Normal   Stress  Stressors:   Housing; Work   Coping Ability:   Human resources officer Deficits:   None   Supports:   Family     Religion: Religion/Spirituality Are You A Religious Person?: No  Leisure/Recreation: Leisure / Recreation Do You Have Hobbies?: Yes Leisure and Hobbies: walking, listening to music, playing  minature games with father, shopping, spending time with family  Exercise/Diet: Exercise/Diet Do You Exercise?: No Have You Gained or Lost A Significant Amount of Weight in the Past Six Months?: No Do You Follow a Special Diet?: No Do You Have Any Trouble Sleeping?: No   CCA Employment/Education Employment/Work Situation: Employment / Work Situation Employment Situation: Employed (part time but would like to work full time) 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?: No  Education: Education Is Patient Currently Attending School?: No Last Grade Completed: 12 Did Garment/textile technologist From [employer]?: Yes Did You Attend College?: Yes What Type of College Degree Do you Have?: Doctor, general practice- BS psychology Did You Attend Graduate School?: No Did You Have Any Special Interests In School?: exploration of Masters program Did You Have An Individualized Education Program (IIEP): Yes (IEP in HS- learning disabilty  Autism Spectrum disorder) Did You Have Any Difficulty At School?: Yes (test taking) Were Any Medications Ever Prescribed For These Difficulties?: No Patient's Education Has Been Impacted by Current Illness: No   CCA Family/Childhood History Family and Relationship History: Family history Marital status: Single Are you sexually active?: No What is your sexual orientation?: "Just male." Does patient have children?: No  Childhood History:  Childhood History By whom was/is the patient raised?: Both  parents Additional childhood history information: Erik Saunders shares was raise by his biological parents and reports to have lived in [place] since the age of 83. Describes his childhood as "overyall it went pretty well. I would make an exception in middle school; I had really bad anger issues at that time." Description of patient's relationship with caregiver when they were a child: Mother: "we got along pretty well." Father: same Patient's description of current relationship with people who raised him/her: Mother: "we get a long and help each other out." Father: "we like to do things with each other." How were you disciplined when you got in trouble as a child/adolescent?: - Does patient have siblings?: No Did patient suffer any verbal/emotional/physical/sexual abuse as a child?: No Did patient suffer from severe childhood neglect?: No Has patient ever been sexually abused/assaulted/raped as an adolescent or adult?: No Was the patient ever a victim of a crime or a disaster?: No Witnessed domestic violence?: No Has patient been affected by domestic violence as an adult?: No  Child/Adolescent Assessment:     CCA Substance Use Alcohol/Drug Use: Alcohol / Drug Use Prescriptions: None History of alcohol / drug use?: Yes Substance #1 Name of Substance 1: Alcohol 1 - Age of First Use: 19 1 - Amount (size/oz): 1 to 2 drinks 1 - Frequency: 2 to 3 times a week 1 - Duration: 2 years 1 - Last Use / Amount: 2 to 3 days ago 1 - Method of Aquiring: purchase 1- Route of Use: oral                       ASAM's:  Six Dimensions of Multidimensional Assessment  Dimension 1:  Acute Intoxication and/or Withdrawal Potential:      Dimension 2:  Biomedical Conditions and Complications:      Dimension 3:  Emotional, Behavioral, or Cognitive Conditions and Complications:     Dimension 4:  Readiness to Change:     Dimension 5:  Relapse, Continued use, or Continued Problem Potential:      Dimension 6:  Recovery/Living Environment:     ASAM Severity Score:    ASAM Recommended Level of Treatment:     Substance use Disorder (SUD)    Recommendations for Services/Supports/Treatments: Recommendations for Services/Supports/Treatments Recommendations For Services/Supports/Treatments: Medication Management, Individual Therapy  DSM5 Diagnoses: Patient Active Problem List   Diagnosis Date Noted   Autism spectrum disorder 09/18/2022  Generalized anxiety disorder 09/18/2022   Urinary retention 05/21/2021   Immunization due 05/21/2021   Asymptomatic microscopic hematuria 05/21/2021   Summary:  Erik Saunders 22 year old Caucasian single male who preents for routine assessment at [facility] OP, self referred. Courvoisier reports history of being diagnosed with generalized anxiety x 10 years ago; notes to be based on things in which he would rather not disclose at this time. Autism dx. Reports current stressor to include moving and dealing with current housing concerns; notes to also be looking for a better job.   Erik Saunders presents for assessment alert and oriented; mood and affect adequate; neutral. Speech clear and coherent at normal rate and tone. Engaged and cooperaitive with assessment. Notes feelings of anxiety dating back to middle school and high school and reports would worry and stress over his grades. Reports recent increase with stress of having to move suddenly and notes feelings of increase irritability and agitation have presented. Reports difficulty at times managing his temper. Denies concerns for depressive sxs; denies hx of suicidal thoughts or actions. Endorses anxiety AEB excessive worry, difficulty controlling the worry, restless sleep with increased irritability. Hx of anxiety attacks reported. Shares when upset anger can be explosive. Denies mood swings/mania. Denies trauma events or sxs. Denies psychotic sxs. Shares use of alcohol weekly of no more than x 2 drinks and denies problematic  drinking behaviors. Currently works part time and would like to explore full time work. Denies legal concerns. Shares to live at home with family who he reports is supportive. Denies SI/HI/AVH. CSSRS, pain, nutrition, GAD and PHQ completed.   GAD: 9 PHQ: 3 AUDIT: 3   Patient Centered Plan: Patient is on the following Treatment Plan(s):  Anxiety   Referrals to Alternative Service(s): Referred to Alternative Service(s):   Place:   Date:   Time:    Referred to Alternative Service(s):   Place:   Date:   Time:    Referred to Alternative Service(s):   Place:   Date:   Time:    Referred to Alternative Service(s):   Place:   Date:   Time:      Collaboration of Care: Other None  Patient/Guardian was advised Release of Information must be obtained prior to any record release in order to collaborate their care with an outside provider. Patient/Guardian was advised if they have not already done so to contact the registration department to sign all necessary forms in order for [place] to release information regarding their care.   Consent: Patient/Guardian gives verbal consent for treatment and assignment of benefits for services provided during this visit. Patient/Guardian expressed understanding and agreed to proceed.   Dorris Singh, [facility]

## 2022-09-25 ENCOUNTER — Ambulatory Visit (HOSPITAL_COMMUNITY): Payer: Medicaid Other | Admitting: Psychiatry

## 2022-10-28 ENCOUNTER — Encounter (HOSPITAL_COMMUNITY): Payer: Self-pay

## 2022-10-28 ENCOUNTER — Ambulatory Visit (INDEPENDENT_AMBULATORY_CARE_PROVIDER_SITE_OTHER): Payer: Medicaid Other | Admitting: Mental Health

## 2022-10-28 DIAGNOSIS — F411 Generalized anxiety disorder: Secondary | ICD-10-CM

## 2022-10-28 DIAGNOSIS — F840 Autistic disorder: Secondary | ICD-10-CM
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
## 2022-12-04 ENCOUNTER — Ambulatory Visit (INDEPENDENT_AMBULATORY_CARE_PROVIDER_SITE_OTHER): Payer: Medicaid Other | Admitting: Mental Health

## 2022-12-04 DIAGNOSIS — F411 Generalized anxiety disorder: Secondary | ICD-10-CM

## 2022-12-04 DIAGNOSIS — F840 Autistic disorder: Secondary | ICD-10-CM

## 2022-12-04 NOTE — Progress Notes (Signed)
   THERAPIST PROGRESS NOTE  Session Time: 2:10 pm (30 minutes)   Participation Level: Active  Behavioral Response: CasualAlertEuthymic  Type of Therapy: Individual Therapy  Treatment Goals addressed: STG: "Temper issues." Laker will increase management of irritable/agitated moods AEB development of distress tolerance coping skills with ability to communicate feelings in appropriate manner per self report within the next 90 days   ProgressTowards Goals: Progressing  Interventions: Supportive  Summary: Erik Saunders is a 22 y.o. male who presents with dx of generalized anxiety and autism dx.Presents with chief complaint of anxiety. Shares for there to have been improvement with anxiety and shares for moods to have been well. Notes ongoing stressors of looking for additional employment and desire for family to move out of current apartment home. Notes some employment prospects he is planning on following up on. Notes coping skills include going for walks and talking to family. Has been more mindful of feelings of irritability and not allowing it to continue. Shares spending time outdoors. Sleeping well, concerns for appetite with snacking on junk food. Notes able to communicate feelings when needed and managing stress. Denies SI/HI. Progress with goals, sxs stable.    Suicidal/Homicidal: Nowithout intent/plan  Therapist Response: Therapist engaged Erik Saunders in therapy session. Completed check in and assessed for current level of stressors and feelings of frustration related to stressors. Provided supportive feedback and validated feelings. Engaged Erik Saunders in exploring current stressors and abiity to manage and cope. Completion and review of GAD with score of 6. Explored routine and balance in daily life. Encouraged following up with primary care with last reported annual over x 2 years ago. Explored communication style and ability to process distress in effective manner. Discussed writing down  stressors as needed. Reviewed session and provided follow up.   Plan: Return again in  x 6 weeks.  Diagnosis: Generalized anxiety disorder  Autism spectrum disorder  Collaboration of Care: Other None  Patient/Guardian was advised Release of Information must be obtained prior to any record release in order to collaborate their care with an outside provider. Patient/Guardian was advised if they have not already done so to contact the registration department to sign all necessary forms in order for [place] to release information regarding their care.   Consent: Patient/Guardian gives verbal consent for treatment and assignment of benefits for services provided during this visit. Patient/Guardian expressed understanding and agreed to proceed.   Stephan Minister [place], [facility] 12/04/2022

## 2023-01-21 ENCOUNTER — Ambulatory Visit (HOSPITAL_COMMUNITY): Payer: Medicaid Other | Admitting: Mental Health

## 2023-02-27 ENCOUNTER — Encounter (HOSPITAL_COMMUNITY): Payer: Self-pay

## 2023-02-27 ENCOUNTER — Emergency Department (HOSPITAL_COMMUNITY)
Admission: EM | Admit: 2023-02-27 | Discharge: 2023-02-27 | Disposition: A | Discharge status: Home / Self Care | Payer: No Typology Code available for payment source | Attending: Emergency Medicine | Admitting: Emergency Medicine

## 2023-02-27 ENCOUNTER — Other Ambulatory Visit: Payer: Self-pay

## 2023-02-27 DIAGNOSIS — S60455A Superficial foreign body of left ring finger, initial encounter: Secondary | ICD-10-CM | POA: Diagnosis not present

## 2023-02-27 DIAGNOSIS — S6992XA Unspecified injury of left wrist, hand and finger(s), initial encounter: Secondary | ICD-10-CM | POA: Diagnosis present

## 2023-02-27 DIAGNOSIS — Z23 Encounter for immunization: Secondary | ICD-10-CM | POA: Insufficient documentation

## 2023-02-27 DIAGNOSIS — W458XXA Other foreign body or object entering through skin, initial encounter: Secondary | ICD-10-CM | POA: Diagnosis not present

## 2023-02-27 DIAGNOSIS — T148XXA Other injury of unspecified body region, initial encounter: Secondary | ICD-10-CM

## 2023-02-27 MED ORDER — LORAZEPAM 0.5 MG PO TABS
0.5000 mg | ORAL_TABLET | Freq: Once | ORAL | Status: AC
  Administered 2023-02-27: 0.5 mg via ORAL
  Filled 2023-02-27: qty 1

## 2023-02-27 MED ORDER — TETANUS-DIPHTH-ACELL PERTUSSIS 5-2.5-18.5 LF-MCG/0.5 IM SUSY
0.5000 mL | PREFILLED_SYRINGE | Freq: Once | INTRAMUSCULAR | Status: AC
  Administered 2023-02-27: 0.5 mL via INTRAMUSCULAR
  Filled 2023-02-27: qty 0.5

## 2023-02-27 MED ORDER — NEOSPORIN ORIGINAL 3.5-400-5000 EX OINT
TOPICAL_OINTMENT | Freq: Two times a day (BID) | CUTANEOUS | Status: DC
  Filled 2023-02-27: qty 1

## 2023-02-27 MED ORDER — LIDOCAINE-EPINEPHRINE-TETRACAINE (LET) TOPICAL GEL
3.0000 mL | Freq: Once | TOPICAL | Status: AC
  Administered 2023-02-27: 3 mL via TOPICAL
  Filled 2023-02-27: qty 3

## 2023-02-27 NOTE — ED Provider Notes (Signed)
[place] EMERGENCY DEPARTMENT AT [facility] Provider Note   CSN: [id number] Arrival date & time: 02/27/23  1132     History  Chief Complaint  Patient presents with   Finger Injury    Erik Saunders is a 22 y.o. male with PMHx anxiety and autism who presents to ED concerned for Splinter in his left ring finger x16hours. Patient was moving boxes and believes that it is a wood splinter lodged under his fingernail. Patient stating that he went to PCP today to get it removed which was unsuccessful.   HPI     Home Medications Prior to Admission medications   Medication Sig Start Date End Date Taking? Authorizing Provider  doxycycline (VIBRA-TABS) 100 MG tablet Take 1 tablet (100 mg total) by mouth 2 (two) times daily. 05/27/21   Dorothyann Peng, MD      Allergies    Penicillins    Review of Systems   Review of Systems  Musculoskeletal:        Foreign body    Physical Exam Updated Vital Signs BP 117/75 (BP Location: Right Arm)   Pulse 77   Temp 98.6 F (37 C) (Oral)   Resp 16   Ht 5\' 10"  (1.778 m)   Wt 70.8 kg   SpO2 100%   BMI 22.38 kg/m  Physical Exam Vitals and nursing note reviewed.  Constitutional:      General: He is not in acute distress.    Appearance: He is not ill-appearing or toxic-appearing.  HENT:     Head: Normocephalic and atraumatic.  Eyes:     General: No scleral icterus.       Right eye: No discharge.        Left eye: No discharge.     Conjunctiva/sclera: Conjunctivae normal.  Cardiovascular:     Rate and Rhythm: Normal rate.  Pulmonary:     Effort: Pulmonary effort is normal.  Abdominal:     General: Abdomen is flat.  Skin:    General: Skin is warm and dry.     Capillary Refill: Capillary refill takes less than 2 seconds.     Comments: Approx 1cm wood splinter in nailbed of left ring finger. No spreading erythema, swelling, or increased warmth. Brisk capillary refill.  Neurological:     General: No focal deficit present.      Mental Status: He is alert and oriented to person, place, and time. Mental status is at baseline.  Psychiatric:        Mood and Affect: Mood normal.        Behavior: Behavior normal.     ED Results / Procedures / Treatments   Labs (all labs ordered are listed, but only abnormal results are displayed) Labs Reviewed - No data to display  EKG None  Radiology No results found.  Procedures Procedures    Medications Ordered in ED Medications  Neosporin Original [phone] OINT ( Topical Given 02/27/23 1307)  LORazepam (ATIVAN) tablet 0.5 mg (0.5 mg Oral Given 02/27/23 1224)  lidocaine-EPINEPHrine-tetracaine (LET) topical gel (3 mLs Topical Given 02/27/23 1224)  Tdap (BOOSTRIX) injection 0.5 mL (0.5 mLs Intramuscular Given 02/27/23 1307)    ED Course/ Medical Decision Making/ A&P                                 Medical Decision Making Risk OTC drugs. Prescription drug management.   This patient presents to the ED for  concern of wood splinter, this involves an extensive number of treatment options, and is a complaint that carries with it a high risk of complications and morbidity.  The differential diagnosis includes splinter/foreign body, NV compromise, soft tissue infection   Co morbidities that complicate the patient evaluation  Autism, anxiety   Additional history obtained:  Patient went to PCP today who was not able to remove splinter    Problem List / ED Course / Critical interventions / Medication management  Patient presents to ED concern for splinter lodged underneath ring fingernail x16 hours.  Patient's PCP provided digital block and attempted to remove splinter but was unsuccessful. Patient asking for Ativan before I removed the splinter since he was having pain and anxiety.  Provided patient with Ativan and applied topical LET.  Was able to grab onto splinter with forceps and successfully removed entire splinter.  Minimal bleeding intact after splint  removal that was controlled with pressure.  I cleaned the wound with iodine and saline - applied antibiotic cream and bandage.  Finger with brisk capillary refill and sensation intact after removal splinter.  Recommend patient follow-up with PCP.  Patient verbalized understanding of plan. I have reviewed the patients home medicines and have made adjustments as needed Patient afebrile with stable vitals.  Return precautions.  Discharged good condition.   Social Determinants of Health:  none          Final Clinical Impression(s) / ED Diagnoses Final diagnoses:  Splinter    Rx / DC Orders ED Discharge Orders     None         Dorthy Cooler, [place] 02/27/23 1356    Vanetta Mulders, MD 03/01/23 [phone]

## 2023-02-27 NOTE — Discharge Instructions (Addendum)
Was a pleasure caring for you today.  Respectively successfully removed.  Please follow-up with your primary care provider next.  Antibiotics.  Seek emergency care if experiencing any new or worsening symptoms.

## 2023-02-27 NOTE — ED Triage Notes (Addendum)
Patient got a splinter in his left ring finger last night. His doctor numbed his finger with lidocaine to try and get it out, unsuccessful. Unknown when last tetanus was.

## 2023-03-10 ENCOUNTER — Ambulatory Visit (INDEPENDENT_AMBULATORY_CARE_PROVIDER_SITE_OTHER): Payer: MEDICAID | Admitting: Mental Health

## 2023-03-10 DIAGNOSIS — F411 Generalized anxiety disorder: Secondary | ICD-10-CM | POA: Diagnosis not present

## 2023-03-10 DIAGNOSIS — F840 Autistic disorder: Secondary | ICD-10-CM

## 2023-03-10 NOTE — Progress Notes (Signed)
THERAPIST PROGRESS NOTE  Session Time: 11:04 am   Participation Level: Active  Behavioral Response: CasualAlertAdequate   Type of Therapy: Individual Therapy  Treatment Goals addressed: STG: "Temper issues." Erik Saunders will increase management of irritable/agitated moods AEB development of distress tolerance coping skills with ability to communicate feelings in appropriate manner per self report within the next 90 days   Active     Anxiety     LTG: Erik Saunders will score less than 5 on the Generalized Anxiety Disorder 7 Scale (GAD-7)  (Progressing)     Start:  10/28/22    Expected End:  10/27/23         STG: "Temper issues." Erik Saunders will increase management of irritable/agitated moods AEB development of distress tolerance coping skills with ability to communicate feelings in appropriate manner per self report within the next 90 days (Progressing)     Start:  10/28/22    Expected End:  10/27/23      Update: 03/10/2023: Erik Saunders reports improvement with mood secondary to engagement with therapist. Erik Saunders to be working an additional job to support with financial stressors. Copes with moods with discussing concerns with family and states to also like to play card cars to help distress. Progress ongoing.          ProgressTowards Goals: Progressing  Interventions: CBT and Supportive  Summary: Erik Saunders is a 22 y.o. male who presents with dx of generalized anxiety and autism dx.Presents with chief complaint of concerns for obtaining new employment. Shares for there to have been improvement with anxiety and shares for moods to have been well. Reports concern for housing continues with concern for neighbors to have a plethora of dogs in which is causing a "wet dog smell" in him and his families home. 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 Shares with therapist of new  employment with [employer] sharing for this to be strenuous work and share to have already suffered a work related injury. Hopeful of obtaining new employment and reports desire to obtain his license and a car with financial concerns being a barrier for him at this time. Reports ability to communicate thoughts and feelings well with family and managing moods adequately and shares with therapist most recent event that upset him with transportation to work. Denies safety concerns.    Suicidal/Homicidal: Nowithout intent/plan  Therapist Response:  Therapist engaged Erik Saunders in therapy session. Completed check in and assessed for current level of stressors and feelings of frustration related to stressors. Provided supportive feedback and validated feelings. Engaged Erik Saunders in exploring current benefits and draw backs of current employment and exploring what he feels is best for him. Supported in processing concerns for neighbors. Reviewed for mood concerns and ability to communicate and express himself. Assessed for anger concerns and irritable mood and presence of coping skills. Reviewed working to [employer] process thoughts in balanced fashion with evidence for thoughts.  Reviewed treatment goal and updated treatment plan. Appropriate progress with no additional concerns at this time. Denies SI/HI  Plan: Return again in  x 5 weeks.  Diagnosis: Generalized anxiety disorder  Autism spectrum disorder  Collaboration of Care: Other None   Patient/Guardian was advised Release of Information must be obtained prior to any record release in order to collaborate their care with an outside provider. Patient/Guardian was advised if they have not already done so to contact the registration department  to sign all necessary forms in order for [place] to release information regarding their care.   Consent: Patient/Guardian gives verbal consent for treatment and assignment of benefits for services provided during this visit.  Patient/Guardian expressed understanding and agreed to proceed.   Stephan Minister [place], [facility] 03/10/2023

## 2023-04-28 ENCOUNTER — Ambulatory Visit (HOSPITAL_COMMUNITY): Payer: Self-pay | Admitting: Mental Health
